# Patient Record
Sex: Female | Born: 1975 | Hispanic: Yes | Marital: Married
Health system: Midwestern US, Academic
[De-identification: ages and names within clinical notes are randomized; demographics above are authoritative.]

---

## 2022-01-05 ENCOUNTER — Ambulatory Visit: Admit: 2022-01-05 | Discharge: 2022-01-05 | Payer: Commercial Managed Care - HMO

## 2022-01-05 ENCOUNTER — Encounter: Admit: 2022-01-05 | Discharge: 2022-01-05 | Payer: Commercial Managed Care - HMO

## 2022-01-05 DIAGNOSIS — K429 Umbilical hernia without obstruction or gangrene: Secondary | ICD-10-CM

## 2022-01-05 DIAGNOSIS — Z411 Encounter for cosmetic surgery: Secondary | ICD-10-CM

## 2022-01-05 DIAGNOSIS — D649 Anemia, unspecified: Secondary | ICD-10-CM

## 2022-01-05 NOTE — Progress Notes
Subjective:       History of Present Illness  Stacy Parsons is a 46 y.o. female.  46 year old female presents to discuss her abdomen.    She is unhappy with the excess skin.  This has not improved despite diet and exercise.  She has past surgical history including a previous laparotomy for a teratoma and laparoscopic ventral hernia repair.           Review of Systems   Constitutional: Negative.    HENT: Negative.    Eyes: Negative.    Respiratory: Negative.    Cardiovascular: Negative.    Gastrointestinal: Negative.    Endocrine: Negative.    Genitourinary: Negative.    Musculoskeletal: Negative.    Skin: Negative.    Allergic/Immunologic: Negative.    Neurological: Negative.    Hematological: Negative.    Psychiatric/Behavioral: Negative.          Objective:         No current outpatient medications on file.     Vitals:    01/05/22 1505   PainSc: Zero     Body mass index is 28.66 kg/m.     Physical Exam  Abdominal:                  Assessment and Plan:    Problem   Elective Procedure for Unacceptable Cosmetic Appearance            I reviewed her old imaging post hernia repair.  I also reviewed her operative report.  All of this was reviewed and discussed with both her and her spouse.  Told her I felt uncomfortable doing her abdominoplasty since a large piece of proceed mesh was placed.
